# Patient Record
Sex: Male | Born: 2000 | Race: White | Hispanic: No | Marital: Single | Smoking: Never smoker
Health system: Southern US, Community
[De-identification: ages and names within clinical notes are randomized; demographics above are authoritative.]

---
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
## 2022-03-13 ENCOUNTER — Emergency Department (HOSPITAL_BASED_OUTPATIENT_CLINIC_OR_DEPARTMENT_OTHER)
Admission: EM | Admit: 2022-03-13 | Discharge: 2022-03-13 | Disposition: A | Discharge status: Home / Self Care | Payer: 59 | Attending: Emergency Medicine | Admitting: Emergency Medicine

## 2022-03-13 ENCOUNTER — Emergency Department (HOSPITAL_BASED_OUTPATIENT_CLINIC_OR_DEPARTMENT_OTHER): Payer: 59

## 2022-03-13 ENCOUNTER — Encounter (HOSPITAL_BASED_OUTPATIENT_CLINIC_OR_DEPARTMENT_OTHER): Payer: Self-pay

## 2022-03-13 ENCOUNTER — Other Ambulatory Visit: Payer: Self-pay

## 2022-03-13 DIAGNOSIS — X500XXA Overexertion from strenuous movement or load, initial encounter: Secondary | ICD-10-CM | POA: Diagnosis not present

## 2022-03-13 DIAGNOSIS — M545 Low back pain, unspecified: Secondary | ICD-10-CM | POA: Diagnosis not present

## 2022-03-13 MED ORDER — KETOROLAC TROMETHAMINE 15 MG/ML IJ SOLN
15.0000 mg | Freq: Once | INTRAMUSCULAR | Status: AC
  Administered 2022-03-13: 15 mg via INTRAMUSCULAR
  Filled 2022-03-13: qty 1

## 2022-03-13 MED ORDER — LIDOCAINE 5 % EX PTCH
1.0000 | MEDICATED_PATCH | CUTANEOUS | Status: DC
  Administered 2022-03-13: 1 via TRANSDERMAL
  Filled 2022-03-13: qty 1

## 2022-03-13 MED ORDER — METHOCARBAMOL 500 MG PO TABS: 500.0000 mg | ORAL_TABLET | Freq: Two times a day (BID) | ORAL | 0 refills | Status: AC

## 2022-03-13 MED ORDER — NAPROXEN 500 MG PO TABS: 500.0000 mg | ORAL_TABLET | Freq: Two times a day (BID) | ORAL | 0 refills | Status: AC

## 2022-03-13 NOTE — ED Provider Notes (Signed)
[name]   CSN: [id number] Arrival date & time: 03/13/22  [id number]     History  Chief Complaint  Patient presents with   Back Pain    Matthew Chavez is a 22 y.o. male with no significant past medical history who presents to the ED due to persistent low back pain for the past 1 to 2 years.  No known injury.  Admits to lifting heavy things at work.  Denies saddle anesthesia, bowel/bladder incontinence, lower extremity numbness/tingling, lower extremity weakness.  No fever or chills.  Denies IV drug use.  No history of cancer.  Denies abdominal pain.  No urinary symptoms.  No previous back surgeries.  History obtained from patient and past medical records. No interpreter used during encounter.       Home Medications Prior to Admission medications   Medication Sig Start Date End Date Taking? Authorizing Provider  methocarbamol (ROBAXIN) 500 MG tablet Take 1 tablet (500 mg total) by mouth 2 (two) times daily. 03/13/22  Yes Rishikesh Khachatryan C, PA-C  naproxen (NAPROSYN) 500 MG tablet Take 1 tablet (500 mg total) by mouth 2 (two) times daily. 03/13/22  Yes Suzy Bouchard, PA-C      Allergies    Patient has no known allergies.    Review of Systems   Review of Systems  Musculoskeletal:  Positive for back pain.  Neurological:  Negative for weakness and numbness.  All other systems reviewed and are negative.   Physical Exam Updated Vital Signs BP 102/61   Pulse (!) 56   Temp 98.3 F (36.8 C)   Resp 16   Ht 6' 3"$  (1.905 m)   Wt 72.6 kg   SpO2 100%   BMI 20.00 kg/m  Physical Exam Vitals and nursing note reviewed.  Constitutional:      General: He is not in acute distress.    Appearance: He is not ill-appearing.  HENT:     Head: Normocephalic.  Eyes:     Pupils: Pupils are equal, round, and reactive to light.  Cardiovascular:     Rate and Rhythm: Normal rate and regular rhythm.     Pulses: Normal pulses.      Heart sounds: Normal heart sounds. No murmur heard.    No friction rub. No gallop.  Pulmonary:     Effort: Pulmonary effort is normal.     Breath sounds: Normal breath sounds.  Abdominal:     General: Abdomen is flat. There is no distension.     Palpations: Abdomen is soft.     Tenderness: There is no abdominal tenderness. There is no guarding or rebound.  Musculoskeletal:        General: Normal range of motion.     Cervical back: Neck supple.     Comments: No thoracic or lumbar midline tenderness. Lower extremities neurovascularly intact  Skin:    General: Skin is warm and dry.  Neurological:     General: No focal deficit present.     Mental Status: He is alert.  Psychiatric:        Mood and Affect: Mood normal.        Behavior: Behavior normal.     ED Results / Procedures / Treatments   Labs (all labs ordered are listed, but only abnormal results are displayed) Labs Reviewed - No data to display  EKG None  Radiology DG Lumbar Spine Complete  Result Date: 03/13/2022 CLINICAL DATA:  Low back pain.  EXAM: LUMBAR SPINE - COMPLETE 4+ VIEW COMPARISON:  None Available. FINDINGS: There are 5 non-rib-bearing lumbar-type vertebral bodies. Vertebral body heights are preserved, without evidence of acute injury. There is mild levocurvature. Alignment is otherwise normal, with no antero or retrolisthesis. There is no spondylolysis. The disc heights are preserved. There is no significant degenerative change. The SI joints are intact.  The soft tissues are unremarkable. IMPRESSION: Mild lumbar levocurvature. Otherwise unremarkable lumbar spine radiographs. Electronically Signed   By: Valetta Mole M.D.   On: 03/13/2022 12:47    Procedures Procedures    Medications Ordered in ED Medications  ketorolac (TORADOL) 15 MG/ML injection 15 mg (has no administration in time range)  lidocaine (LIDODERM) 5 % 1 patch (has no administration in time range)    ED Course/ Medical Decision Making/ A&P                              Medical Decision Making Amount and/or Complexity of Data Reviewed Radiology: ordered and independent interpretation performed. Decision-making details documented in ED Course.  Risk Prescription drug management.   This patient presents to the ED for concern of back pain, this involves an extensive number of treatment options, and is a complaint that carries with it a high risk of complications and morbidity.  The differential diagnosis includes tumor strain, bony fracture, infectious etiology, etc  22 year old male presents to the ED due to persistent low back pain for the past 1 to 2 years.  No known injury.  Denies saddle anesthesia, bowel/bladder incontinence, lower extremity numbness/tingling, lower extremity weakness.  No IV drug use.  No history of cancer.  Upon arrival, stable vitals.  Patient in no acute distress.  Benign physical exam.  No thoracic or lumbar midline tenderness. bilateral lower extremities neurovascularly intact with soft compartments.  Low suspicion for cauda equina or central cord compression.  X-ray ordered which I personally reviewed and interpreted which demonstrates mild lumbar levocurvature.  No bony fractures. No infectious symptoms to suggest infectious etiology. Patient treated with Toradol and Lidoderm patch here in the ED with improvement in pain.  Patient discharged with symptomatic treatment.  Advised patient to follow-up with PCP if symptoms not improve over the next few days. Strict ED precautions discussed with patient. Patient states understanding and agrees to plan. Patient discharged home in no acute distress and stable vitals  Has PCP       Final Clinical Impression(s) / ED Diagnoses Final diagnoses:  Acute bilateral low back pain without sciatica    Rx / DC Orders ED Discharge Orders          Ordered    naproxen (NAPROSYN) 500 MG tablet  2 times daily        03/13/22 1251    methocarbamol (ROBAXIN) 500 MG  tablet  2 times daily        03/13/22 1251              Suzy Bouchard, [place] 03/13/22 1311    Audley Hose, MD 03/28/22 2127

## 2022-03-13 NOTE — ED Notes (Signed)
Urine specimen in lab 

## 2022-03-13 NOTE — Discharge Instructions (Signed)
It was a pleasure taking care of you today.  As discussed, your x-ray showed some curvature of your spine.  I am sending you home with pain medication and a muscle relaxer.  Muscle relaxer can cause drowsiness so do not drive or operate machinery while on the medication.  You may also purchase over-the-counter Lidoderm patches and Voltaren gel for added pain relief.  Please follow-up with PCP if symptoms not improve over the next few days.  Return to the ER for any worsening symptoms.

## 2022-03-13 NOTE — ED Triage Notes (Signed)
States he has had mid lumbar back pain intermittently x 1 year. States pain is worse to where he can't sleep, worse with movement. Denies injury. Denies urinary symptoms
